# Patient Record
Sex: Female | Born: 2005
Health system: Southern US, Community
[De-identification: ages and names within clinical notes are randomized; demographics above are authoritative.]

## PROBLEM LIST (undated history)

## (undated) DIAGNOSIS — S92402A Displaced unspecified fracture of left great toe, initial encounter for closed fracture: Secondary | ICD-10-CM

## (undated) HISTORY — DX: Displaced unspecified fracture of left great toe, initial encounter for closed fracture: S92.402A

---
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
## 2023-06-08 ENCOUNTER — Ambulatory Visit (INDEPENDENT_AMBULATORY_CARE_PROVIDER_SITE_OTHER): Payer: 59 | Admitting: Physician Assistant

## 2023-06-08 VITALS — BP 112/72 | HR 64 | Temp 98.0°F | Ht 68.0 in | Wt 170.8 lb

## 2023-06-08 DIAGNOSIS — Z0000 Encounter for general adult medical examination without abnormal findings: Secondary | ICD-10-CM

## 2023-06-08 DIAGNOSIS — N898 Other specified noninflammatory disorders of vagina: Secondary | ICD-10-CM

## 2023-06-08 DIAGNOSIS — Z23 Encounter for immunization: Secondary | ICD-10-CM | POA: Diagnosis not present

## 2023-06-08 DIAGNOSIS — D485 Neoplasm of uncertain behavior of skin: Secondary | ICD-10-CM | POA: Diagnosis not present

## 2023-06-08 DIAGNOSIS — L905 Scar conditions and fibrosis of skin: Secondary | ICD-10-CM

## 2023-06-08 MED ORDER — FLUCONAZOLE 150 MG PO TABS
150.0000 mg | ORAL_TABLET | Freq: Every day | ORAL | 0 refills | Status: DC
Start: 2023-06-08 — End: 2024-01-20

## 2023-06-08 NOTE — Addendum Note (Signed)
Addended by: Ila Mcgill on: 06/08/2023 09:40 AM   Modules accepted: Orders

## 2023-06-08 NOTE — Progress Notes (Signed)
Subjective:    Patient ID: Traci Jennings, female    DOB: 04/28/2006, 17 y.o.   MRN: [id number]  Chief Complaint  Patient presents with   Annual Exam    Pt in office for annual CPE and fasting labs; pt c/o recent yeast infection 2 wks ago, took Diflucan but came back. Wanting to know if she can have a Rx to take 1 more and to keep on hand for when this happens. Thinks it is due to her birth control.    HPI Patient is in today for annual CPE. Working full-time, going into college soon as well.   Acute concerns: A few weeks ago, had some vaginal discharge, felt somewhat uncomfortable. Took Diflucan and felt better. Now has irritation again around introitus, but no pain.   Health maintenance: Lifestyle/ exercise: Walking at work, stays physically active  Nutrition: Better balance lately; does not eat breakfast, usually small meals / snacks Mental health: doing ok Sleep: doing ok Substance use: None Sexual activity: Boyfriend  Immunizations: Updated HPV today  Skin: Right upper arm dark lesion, slightly raised   Past Medical History:  Diagnosis Date   Fracture of left great toe     No past surgical history on file.  Family History  Problem Relation Age of Onset   Hypertension Father    Hyperlipidemia Father    Diabetes Father    Hypertension Maternal Grandmother    Diabetes Maternal Grandmother    Diabetes Maternal Grandfather    COPD Paternal Grandfather     Social History   Tobacco Use   Smoking status: Never   Smokeless tobacco: Never  Vaping Use   Vaping Use: Former  Substance Use Topics   Alcohol use: No   Drug use: No     No Known Allergies  Review of Systems NEGATIVE UNLESS OTHERWISE INDICATED IN HPI      Objective:     BP 112/72 (BP Location: Left Arm)   Pulse 64   Temp 98 F (36.7 C) (Temporal)   Ht 5\' 8"  (1.727 m)   Wt 170 lb 12.8 oz (77.5 kg)   SpO2 98%   BMI 25.97 kg/m   Wt Readings from Last 3 Encounters:  06/08/23 170 lb 12.8 oz  (77.5 kg) (93 %, Z= 1.47)*  12/24/22 175 lb 8 oz (79.6 kg) (94 %, Z= 1.59)*  10/27/22 181 lb 12.8 oz (82.5 kg) (96 %, Z= 1.71)*   * Growth percentiles are based on CDC (Girls, 2-20 Years) data.    BP Readings from Last 3 Encounters:  06/08/23 112/72  12/24/22 110/60  10/27/22 104/70     Physical Exam Vitals and nursing note reviewed.  Constitutional:      Appearance: Normal appearance. She is normal weight. She is not toxic-appearing.  HENT:     Head: Normocephalic and atraumatic.     Right Ear: Tympanic membrane, ear canal and external ear normal.     Left Ear: Tympanic membrane, ear canal and external ear normal.     Nose: Nose normal.     Mouth/Throat:     Mouth: Mucous membranes are moist.  Eyes:     Extraocular Movements: Extraocular movements intact.     Conjunctiva/sclera: Conjunctivae normal.     Pupils: Pupils are equal, round, and reactive to light.  Cardiovascular:     Rate and Rhythm: Normal rate and regular rhythm.     Pulses: Normal pulses.     Heart sounds: Normal heart sounds.  Pulmonary:  Effort: Pulmonary effort is normal.     Breath sounds: Normal breath sounds.  Abdominal:     General: Abdomen is flat. Bowel sounds are normal.     Palpations: Abdomen is soft.  Musculoskeletal:        General: Normal range of motion.     Cervical back: Normal range of motion and neck supple.  Skin:    General: Skin is warm and dry.     Findings: Lesion (right upper arm anterior dark slightly raised nevus) present.  Neurological:     General: No focal deficit present.     Mental Status: She is alert and oriented to person, place, and time.  Psychiatric:        Mood and Affect: Mood normal.        Behavior: Behavior normal.        Assessment & Plan:  Encounter for annual physical exam  Immunization due -     HPV 9-valent vaccine,Recombinat  Vaginal discharge -     Fluconazole; Take 1 tablet (150 mg total) by mouth daily. Repeat dose in 72 hours if still  symptomatic.  Dispense: 2 tablet; Refill: 0  Neoplasm of uncertain behavior of skin   Age-appropriate screening and counseling performed today. Will check labs and call with results. Preventive measures discussed and printed in AVS for patient.   Patient Counseling: [x]   Nutrition: Stressed importance of moderation in sodium/caffeine intake, saturated fat and cholesterol, caloric balance, sufficient intake of fresh fruits, vegetables, and fiber.  [x]   Stressed the importance of regular exercise.   [x]   Substance Abuse: Discussed cessation/primary prevention of tobacco, alcohol, or other drug use; driving or other dangerous activities under the influence; availability of treatment for abuse.   [x]   Injury prevention: Discussed safety belts, safety helmets, smoke detector, smoking near bedding or upholstery.   [x]   Sexuality: Discussed sexually transmitted diseases, partner selection, use of condoms, avoidance of unintended pregnancy  and contraceptive alternatives.   [x]   Dental health: Discussed importance of regular tooth brushing, flossing, and dental visits.  [x]   Health maintenance and immunizations reviewed. Please refer to Health maintenance section.       Return in about 3 weeks (around 06/29/2023), or right arm mole punch biopsy.    Vincient Vanaman M James Lafalce, PA-C

## 2023-06-08 NOTE — Patient Instructions (Addendum)
Schedule next CPE in 1 year  3-4 weeks for punch biopsy   Let me know through Mychart if irritation not improving with Diflucan  Keep working on good lifestyle habits!  Call if concerns.

## 2023-06-09 ENCOUNTER — Telehealth: Payer: Self-pay | Admitting: Physician Assistant

## 2023-06-09 NOTE — Telephone Encounter (Signed)
Patient's mother called stating they were able to get OV with dermatology on 7/18. States PCP was planning on getting a sample of concerning mole to send for testing at 7/11 OV. Caller wants to know if pt should still come in to see PCP on 7/11 or just go to dermatology on 7/18. States she does prefer for pt to go to PCP but would do what's recommended.

## 2023-06-30 ENCOUNTER — Ambulatory Visit: Payer: 59 | Admitting: Physician Assistant

## 2023-07-07 ENCOUNTER — Ambulatory Visit: Payer: 59 | Admitting: Dermatology

## 2023-07-07 VITALS — BP 103/70

## 2023-07-07 DIAGNOSIS — Z7189 Other specified counseling: Secondary | ICD-10-CM

## 2023-07-07 DIAGNOSIS — D229 Melanocytic nevi, unspecified: Secondary | ICD-10-CM

## 2023-07-07 DIAGNOSIS — D2261 Melanocytic nevi of right upper limb, including shoulder: Secondary | ICD-10-CM | POA: Diagnosis not present

## 2023-07-07 DIAGNOSIS — D485 Neoplasm of uncertain behavior of skin: Secondary | ICD-10-CM

## 2023-07-07 HISTORY — DX: Melanocytic nevi, unspecified: D22.9

## 2023-07-07 NOTE — Patient Instructions (Addendum)
Patient Handout: Wound Care for Skin Biopsy Site  Patient Handout: Wound Care for Skin Biopsy Site  Taking Care of Your Skin Biopsy Site  Proper care of the biopsy site is essential for promoting healing and minimizing scarring. This handout provides instructions on how to care for your biopsy site to ensure optimal recovery.  1. Cleaning the Wound:  Clean the biopsy site daily with gentle soap and water. Gently pat the area dry with a clean, soft towel. Avoid harsh scrubbing or rubbing the area, as this can irritate the skin and delay healing.  2. Applying Aquaphor and Bandage:  After cleaning the wound, apply a thin layer of Aquaphor ointment to the biopsy site. Cover the area with a sterile bandage to protect it from dirt, bacteria, and friction. Change the bandage daily or as needed if it becomes soiled or wet.  3. Continued Care for One Week:  Repeat the cleaning, Aquaphor application, and bandaging process daily for one week following the biopsy procedure. Keeping the wound clean and moist during this initial healing period will help prevent infection and promote optimal healing.  4. Massaging Aquaphor into the Area:  ---After one week, discontinue the use of bandages but continue to apply Aquaphor to the biopsy site. ----Gently massage the Aquaphor into the area using circular motions. ---Massaging the skin helps to promote circulation and prevent the formation of scar tissue.   Additional Tips:  Avoid exposing the biopsy site to direct sunlight during the healing process, as this can cause hyperpigmentation or worsen scarring. If you experience any signs of infection, such as increased redness, swelling, warmth, or drainage from the wound, contact your healthcare provider immediately. Follow any additional instructions provided by your healthcare provider for caring for the biopsy site and managing any discomfort. Conclusion:  Taking proper care of your skin biopsy site  is crucial for ensuring optimal healing and minimizing scarring. By following these instructions for cleaning, applying Aquaphor, and massaging the area, you can promote a smooth and successful recovery. If you have any questions or concerns about caring for your biopsy site, don't hesitate to contact your healthcare provider for guidance.      Due to recent changes in healthcare laws, you may see results of your pathology and/or laboratory studies on MyChart before the doctors have had a chance to review them. We understand that in some cases there may be results that are confusing or concerning to you. Please understand that not all results are received at the same time and often the doctors may need to interpret multiple results in order to provide you with the best plan of care or course of treatment. Therefore, we ask that you please give us 2 business days to thoroughly review all your results before contacting the office for clarification. Should we see a critical lab result, you will be contacted sooner.   If You Need Anything After Your Visit  If you have any questions or concerns for your doctor, please call our main line at [phone] If no one answers, please leave a voicemail as directed and we will return your call as soon as possible. Messages left after 4 pm will be answered the following business day.   You may also send us a message via MyChart. We typically respond to MyChart messages within 1-2 business days.  For prescription refills, please ask your pharmacy to contact our office. Our fax number is [phone].  If you have an urgent issue when the clinic is   closed that cannot wait until the next business day, you can page your doctor at the number below.    Please note that while we do our best to be available for urgent issues outside of office hours, we are not available 24/7.   If you have an urgent issue and are unable to reach us, you may choose to seek medical care  at your doctor's office, retail clinic, urgent care center, or emergency room.  If you have a medical emergency, please immediately call 911 or go to the emergency department. In the event of inclement weather, please call our main line at [phone] for an update on the status of any delays or closures.  Dermatology Medication Tips: Please keep the boxes that topical medications come in in order to help keep track of the instructions about where and how to use these. Pharmacies typically print the medication instructions only on the boxes and not directly on the medication tubes.   If your medication is too expensive, please contact our office at [phone] or send us a message through MyChart.   We are unable to tell what your co-pay for medications will be in advance as this is different depending on your insurance coverage. However, we may be able to find a substitute medication at lower cost or fill out paperwork to get insurance to cover a needed medication.   If a prior authorization is required to get your medication covered by your insurance company, please allow us 1-2 business days to complete this process.  Drug prices often vary depending on where the prescription is filled and some pharmacies may offer cheaper prices.  The website www.goodrx.com contains coupons for medications through different pharmacies. The prices here do not account for what the cost may be with help from insurance (it may be cheaper with your insurance), but the website can give you the price if you did not use any insurance.  - You can print the associated coupon and take it with your prescription to the pharmacy.  - You may also stop by our office during regular business hours and pick up a GoodRx coupon card.  - If you need your prescription sent electronically to a different pharmacy, notify our office through [place] MyChart or by phone at [phone]     

## 2023-07-07 NOTE — Progress Notes (Signed)
   New Patient Visit   Subjective  Traci Jennings is a 17 y.o. female who presents for the following: a spot at right upper arm. Patient noticed it getting darker about a year ago and it has gotten raised. Her PCP recommended she have it checked. No fhx of skin cancer that she is aware of. No tanning bed use.    The following portions of the chart were reviewed this encounter and updated as appropriate: medications, allergies, medical history  Review of Systems:  No other skin or systemic complaints except as noted in HPI or Assessment and Plan.  Objective  Well appearing patient in no apparent distress; mood and affect are within normal limits.   A focused examination was performed of the following areas: arms  Relevant exam findings are noted in the Assessment and Plan.  right upper arm 6 mm slightly irregular, brown/black papule       Assessment & Plan   1. Right Upper Arm Mole - Assessment: 6mm, slightly irregular, brown-black papule on the right upper arm. - Plan: Perform shave biopsy to rule out dysplastic nevi or malignancy. Send specimen to the lab for histopathological examination. Await results. - Patient Education: Instruct patient to apply Vaseline and a band-aid for two weeks, followed by massaging Vaseline into the area for an additional two to four weeks to improve scarring. - Follow-up: Notify patient via MyChart if biopsy result is benign. Contact patient by phone if result is abnormal. Ensure patient's correct phone number is on file.  2. Sun Exposure and Skin Care - Plan: Educate patient on risks of tanning and sun damage, including premature aging and increased risk of skin cancer. Advise wearing sunscreen and avoiding excessive sun exposure. - Follow-up: Encourage patient to maintain good sun protection habits and monitor skin for changes. Regular dermatology check-ups as needed.  Neoplasm of uncertain behavior of skin right upper arm  Skin / nail  biopsy Type of biopsy: tangential   Informed consent: discussed and consent obtained   Timeout: patient name, date of birth, surgical site, and procedure verified   Procedure prep:  Patient was prepped and draped in usual sterile fashion Prep type:  Isopropyl alcohol Anesthesia: the lesion was anesthetized in a standard fashion   Anesthetic:  1% lidocaine w/ epinephrine 1-100,000 buffered w/ 8.4% NaHCO3 Instrument used: flexible razor blade   Hemostasis achieved with: pressure, aluminum chloride and electrodesiccation   Outcome: patient tolerated procedure well   Post-procedure details: sterile dressing applied and wound care instructions given   Dressing type: bandage and petrolatum      Return if symptoms worsen or fail to improve.  Anise Salvo, RMA, am acting as scribe for [employer], DO .   Documentation: I have reviewed the above documentation for accuracy and completeness, and I agree with the above.  Traci Reusing, DO

## 2023-07-14 NOTE — Progress Notes (Signed)
Hi Traci Jennings  Dr. Onalee Hua reviewed your biopsy results and they showed the spot removed was a little "abnormal" but not cancerous.  The margins were clear so No additional treatment is required.  We will continue to monitor the area for re-pigmentation during your annual skin exams. The detailed report is available to view in MyChart.  Have a great day!  Kind Regards,  Dr. Kermit Balo Care Team

## 2023-08-04 ENCOUNTER — Encounter: Payer: Self-pay | Admitting: Physician Assistant

## 2023-08-04 ENCOUNTER — Ambulatory Visit (INDEPENDENT_AMBULATORY_CARE_PROVIDER_SITE_OTHER): Payer: 59 | Admitting: Physician Assistant

## 2023-08-04 VITALS — BP 110/70 | HR 73 | Temp 97.5°F | Ht 68.0 in | Wt 168.4 lb

## 2023-08-04 DIAGNOSIS — M79675 Pain in left toe(s): Secondary | ICD-10-CM

## 2023-08-04 NOTE — Patient Instructions (Addendum)
It was great to see you!  Consider buddy taping the area Wear a stiff shoe to prevent bending your foot/toes Consider ibuprofen Ice and elevate  If you choose to get an xray -- here is the info  An order for xray has been put in for you. To have this done, you can walk in at the [facility] location without a scheduled appointment.  The address is [address] It is across the street from [facility]. xray are located in the basement.   Hours of operation are M-F 8:30am to 5:00pm.  Please note that they are closed for lunch between 12:30 and 1:00pm.   Take care,  Jarold Motto PA-C

## 2023-08-04 NOTE — Progress Notes (Signed)
Traci Jennings is a 17 y.o. female here for a new problem.  History of Present Illness:   Chief Complaint  Patient presents with   c/o Toe problem    Pt c/o left Great toe discomfort, injured last week dropped piece a furniture on it.     HPI  Great Toe Pain (Left): Complains of left great toe pain, that began a week ago after dropping furniture onto it.  Describes mild bleeding immediately after trauma and occasional bleeding afterward. Reports mild discoloration, swelling, and numbness while moving.  Denies pain on movement, pain on palpation, or discharge from injury.    Past Medical History:  Diagnosis Date   Fracture of left great toe      Social History   Tobacco Use   Smoking status: Never   Smokeless tobacco: Never  Vaping Use   Vaping status: Former  Substance Use Topics   Alcohol use: No   Drug use: No    No past surgical history on file.  Family History  Problem Relation Age of Onset   Hypertension Father    Hyperlipidemia Father    Diabetes Father    Hypertension Maternal Grandmother    Diabetes Maternal Grandmother    Diabetes Maternal Grandfather    COPD Paternal Grandfather     No Known Allergies  Current Medications:   Current Outpatient Medications:    fluconazole (DIFLUCAN) 150 MG tablet, Take 1 tablet (150 mg total) by mouth daily. Repeat dose in 72 hours if still symptomatic., Disp: 2 tablet, Rfl: 0   levonorgestrel (KYLEENA) 19.5 MG IUD, 1 each by Intrauterine route once., Disp: , Rfl:    Review of Systems:   Review of Systems  Musculoskeletal:        (+) Foot pain (left great toe, see HPI) (+) Foot swelling (left great toe)  Skin:        (+) Discoloration (mild, left great toe)  Endo/Heme/Allergies:        (+) Bleeding (from left great toe after trauma)    Vitals:   Vitals:   08/04/23 0846  BP: 110/70  Pulse: 73  Temp: (!) 97.5 F (36.4 C)  TempSrc: Temporal  SpO2: 99%  Weight: 168 lb 6.1 oz (76.4 kg)  Height: 5\' 8"   (1.727 m)     Body mass index is 25.6 kg/m.  Physical Exam:   Physical Exam Constitutional:      Appearance: Normal appearance. She is well-developed.  HENT:     Head: Normocephalic and atraumatic.  Eyes:     General: Lids are normal.     Extraocular Movements: Extraocular movements intact.     Conjunctiva/sclera: Conjunctivae normal.  Cardiovascular:     Comments: Normal capillary refill Pulmonary:     Effort: Pulmonary effort is normal.  Musculoskeletal:        General: Normal range of motion.     Cervical back: Normal range of motion and neck supple.     Comments: Left great toe with slight erythema to medial nail edge Slight tenderness to palpation to great toe nail Overall normal ROM  Skin:    General: Skin is warm and dry.  Neurological:     Mental Status: She is alert and oriented to person, place, and time.     Comments: Normal sensation to great toe  Psychiatric:        Attention and Perception: Attention and perception normal.        Mood and Affect: Mood normal.  Behavior: Behavior normal.        Thought Content: Thought content normal.        Judgment: Judgment normal.     Assessment and Plan:   Great toe pain, left No red flags Offered xray vs another week of conservative treatment -- recommend buddy taping, hard sole shoe, NSAIDs and elevation She has chosen to do another week of supportive care and will then plan to get an xray if no better (mom was present and wanted xray sooner - I have ordered this now in case she would like to do this now)  [employer] as a scribe for [employer], PA.,have documented all relevant documentation on the behalf of Jarold Motto, PA,as directed by  Jarold Motto, PA while in the presence of Jarold Motto, [place].  I, Jarold Motto, [place], have reviewed all documentation for this visit. The documentation on 08/04/23 for the exam, diagnosis, procedures, and orders are all accurate and  complete.  Jarold Motto, PA-C

## 2023-09-08 NOTE — Progress Notes (Signed)
Established patient visit   Patient: Traci Jennings   DOB: February 08, 2006   16 y.o. Female  MRN: [id number] Visit Date: 09/09/2023  Today's healthcare provider: Alfredia Ferguson, PA-C   Chief Complaint  Patient presents with   bumps    Bumps on legs patient noticed 3 days ago    Subjective    HPI  Pt reports little bumps , itchy, on her legs, upper arms. Reports waxing her legs a few days ago. Worried about bed bugs, contagious rashes.   Medications: Outpatient Medications Prior to Visit  Medication Sig   levonorgestrel (KYLEENA) 19.5 MG IUD 1 each by Intrauterine route once.   fluconazole (DIFLUCAN) 150 MG tablet Take 1 tablet (150 mg total) by mouth daily. Repeat dose in 72 hours if still symptomatic. (Patient not taking: Reported on 09/09/2023)   No facility-administered medications prior to visit.    Review of Systems  Constitutional:  Negative for fatigue and fever.  Respiratory:  Negative for cough and shortness of breath.   Cardiovascular:  Negative for chest pain and leg swelling.  Gastrointestinal:  Negative for abdominal pain.  Skin:  Positive for rash.  Neurological:  Negative for dizziness and headaches.      Objective    BP 104/68 (BP Location: Left Arm, Patient Position: Sitting, Cuff Size: Normal)   Pulse 89   Temp 98.1 F (36.7 C) (Oral)   Resp 18   Wt 171 lb (77.6 kg)   SpO2 98%   BMI 26.00 kg/m   Physical Exam Vitals reviewed.  Constitutional:      Appearance: She is not ill-appearing.  HENT:     Head: Normocephalic.  Eyes:     Conjunctiva/sclera: Conjunctivae normal.  Cardiovascular:     Rate and Rhythm: Normal rate.  Pulmonary:     Effort: Pulmonary effort is normal. No respiratory distress.  Skin:    Comments: Fine prickly rash, non erythematous along b/l legs and upper arms. A few larger spots that are erythematous, two behind R knee that have scabbed over.  Neurological:     General: No focal deficit present.     Mental Status:  She is alert and oriented to person, place, and time.  Psychiatric:        Mood and Affect: Mood normal.        Behavior: Behavior normal.      No results found for any visits on 09/09/23.  Assessment & Plan     1. Rash Appears like keratosis pilaris that pt has scratched and aggravated Recommending topical mupirocin for inflamed spots, otherwise otc hydrocortisone and hydrating lotions/creams  - mupirocin ointment (BACTROBAN) 2 %; Apply 1 Application topically 2 (two) times daily.  Dispense: 30 g; Refill: 1   Return if symptoms worsen or fail to improve.      I, Alfredia Ferguson, PA-C have reviewed all documentation for this visit. The documentation on  09/09/23   for the exam, diagnosis, procedures, and orders are all accurate and complete.    Alfredia Ferguson, PA-C  [facility] Primary Care at [facility] [phone] (phone) [phone] (fax)  [facility]

## 2023-09-09 ENCOUNTER — Ambulatory Visit (INDEPENDENT_AMBULATORY_CARE_PROVIDER_SITE_OTHER): Payer: 59 | Admitting: Physician Assistant

## 2023-09-09 ENCOUNTER — Encounter: Payer: Self-pay | Admitting: Physician Assistant

## 2023-09-09 VITALS — BP 104/68 | HR 89 | Temp 98.1°F | Resp 18 | Wt 171.0 lb

## 2023-09-09 DIAGNOSIS — R21 Rash and other nonspecific skin eruption: Secondary | ICD-10-CM

## 2023-09-09 MED ORDER — MUPIROCIN 2 % EX OINT: 1.0000 | TOPICAL_OINTMENT | Freq: Two times a day (BID) | CUTANEOUS | 1 refills | Status: AC

## 2023-09-09 MED ORDER — MUPIROCIN 2 % EX OINT
1.0000 | TOPICAL_OINTMENT | Freq: Two times a day (BID) | CUTANEOUS | 1 refills | Status: DC
Start: 2023-09-09 — End: 2023-09-09

## 2023-09-14 ENCOUNTER — Other Ambulatory Visit (HOSPITAL_BASED_OUTPATIENT_CLINIC_OR_DEPARTMENT_OTHER): Payer: Self-pay

## 2024-01-20 ENCOUNTER — Other Ambulatory Visit (HOSPITAL_BASED_OUTPATIENT_CLINIC_OR_DEPARTMENT_OTHER): Payer: Self-pay

## 2024-01-20 ENCOUNTER — Ambulatory Visit: Payer: Commercial Managed Care - PPO | Admitting: Physician Assistant

## 2024-01-20 ENCOUNTER — Encounter: Payer: Self-pay | Admitting: Physician Assistant

## 2024-01-20 VITALS — BP 113/75 | HR 60 | Temp 97.7°F | Ht 68.0 in | Wt 168.4 lb

## 2024-01-20 DIAGNOSIS — T3695XA Adverse effect of unspecified systemic antibiotic, initial encounter: Secondary | ICD-10-CM | POA: Diagnosis not present

## 2024-01-20 DIAGNOSIS — N3000 Acute cystitis without hematuria: Secondary | ICD-10-CM | POA: Diagnosis not present

## 2024-01-20 DIAGNOSIS — B379 Candidiasis, unspecified: Secondary | ICD-10-CM | POA: Diagnosis not present

## 2024-01-20 DIAGNOSIS — R1030 Lower abdominal pain, unspecified: Secondary | ICD-10-CM | POA: Diagnosis not present

## 2024-01-20 LAB — POC URINALSYSI DIPSTICK (AUTOMATED)
Blood, UA: NEGATIVE
Glucose, UA: NEGATIVE
Ketones, UA: NEGATIVE
Nitrite, UA: POSITIVE
Protein, UA: NEGATIVE
Spec Grav, UA: 1.015 (ref 1.010–1.025)
Urobilinogen, UA: 0.2 U/dL
pH, UA: 6 (ref 5.0–8.0)

## 2024-01-20 MED ORDER — FLUCONAZOLE 150 MG PO TABS
150.0000 mg | ORAL_TABLET | Freq: Once | ORAL | 0 refills | Status: AC
Start: 2024-01-20 — End: 2024-01-21
  Filled 2024-01-20: qty 1, 1d supply, fill #0

## 2024-01-20 MED ORDER — NITROFURANTOIN MONOHYD MACRO 100 MG PO CAPS
100.0000 mg | ORAL_CAPSULE | Freq: Two times a day (BID) | ORAL | 0 refills | Status: DC
Start: 2024-01-20 — End: 2024-02-16
  Filled 2024-01-20: qty 10, 5d supply, fill #0

## 2024-01-20 NOTE — Progress Notes (Signed)
Established patient visit   Patient: Traci Jennings   DOB: 2006-07-29   18 y.o. Female  MRN: [id number] Visit Date: 01/20/2024  Today's healthcare provider: Alfredia Ferguson, PA-C   Chief Complaint  Patient presents with   Urinary Tract Infection    Some back pain- some cramping. OTC- UTI medicine    Subjective     Pt reports for the last few days, low back pain, abdominal cramping, x 1-2 days.  She has taken Azo over the counter. Denies dysuria, hematuria.   Medications: Outpatient Medications Prior to Visit  Medication Sig   levonorgestrel (KYLEENA) 19.5 MG IUD 1 each by Intrauterine route once.   mupirocin ointment (BACTROBAN) 2 % Apply 1 Application topically 2 (two) times daily.   [DISCONTINUED] fluconazole (DIFLUCAN) 150 MG tablet Take 1 tablet (150 mg total) by mouth daily. Repeat dose in 72 hours if still symptomatic.   No facility-administered medications prior to visit.    Review of Systems  Constitutional:  Negative for fatigue and fever.  Respiratory:  Negative for cough and shortness of breath.   Cardiovascular:  Negative for chest pain and leg swelling.  Gastrointestinal:  Positive for abdominal pain.  Musculoskeletal:  Positive for back pain.  Neurological:  Negative for dizziness and headaches.       Objective    BP 113/75   Pulse 60   Temp 97.7 F (36.5 C) (Oral)   Ht 5\' 8"  (1.727 m)   Wt 168 lb 6 oz (76.4 kg)   SpO2 94%   BMI 25.60 kg/m    Physical Exam Vitals reviewed.  Constitutional:      Appearance: She is not ill-appearing.  HENT:     Head: Normocephalic.  Eyes:     Conjunctiva/sclera: Conjunctivae normal.  Cardiovascular:     Rate and Rhythm: Normal rate.  Pulmonary:     Effort: Pulmonary effort is normal. No respiratory distress.  Abdominal:     Palpations: Abdomen is soft.     Tenderness: There is no abdominal tenderness.  Neurological:     General: No focal deficit present.     Mental Status: She is alert and  oriented to person, place, and time.  Psychiatric:        Mood and Affect: Mood normal.        Behavior: Behavior normal.      Results for orders placed or performed in visit on 01/20/24  POCT Urinalysis Dipstick (Automated)  Result Value Ref Range   Color, UA Yellow    Clarity, UA Clear    Glucose, UA Negative Negative   Bilirubin, UA Small    Ketones, UA Negative    Spec Grav, UA 1.015 1.010 - 1.025   Blood, UA Negative    pH, UA 6.0 5.0 - 8.0   Protein, UA Negative Negative   Urobilinogen, UA 0.2 0.2 or 1.0 E.U./dL   Nitrite, UA Positive    Leukocytes, UA Trace (A) Negative    Assessment & Plan    Acute cystitis without hematuria -     Nitrofurantoin Monohyd Macro; Take 1 capsule (100 mg total) by mouth 2 (two) times daily.  Dispense: 10 capsule; Refill: 0  Antibiotic-induced yeast infection -     Fluconazole; Take 1 tablet (150 mg total) by mouth once for 1 dose.  Dispense: 1 tablet; Refill: 0  Lower abdominal pain -     POCT Urinalysis Dipstick (Automated)   UA + leuks, nitrites. Rx macrobid bid, ok to  cont Azo.  Rx diflucan in case of abx induced yeast infection  Return if symptoms worsen or fail to improve.       Alfredia Ferguson, PA-C  [facility] Primary Care at [facility] [phone] (phone) [phone] (fax)  [facility]

## 2024-01-21 LAB — URINE CULTURE
MICRO NUMBER:: 16025577
Result:: NO GROWTH
SPECIMEN QUALITY:: ADEQUATE

## 2024-01-23 ENCOUNTER — Encounter: Payer: Self-pay | Admitting: Physician Assistant

## 2024-02-15 ENCOUNTER — Ambulatory Visit: Payer: Commercial Managed Care - PPO | Admitting: Physician Assistant

## 2024-02-15 ENCOUNTER — Encounter: Payer: Self-pay | Admitting: Physician Assistant

## 2024-02-15 ENCOUNTER — Other Ambulatory Visit (HOSPITAL_COMMUNITY)
Admission: RE | Admit: 2024-02-15 | Discharge: 2024-02-15 | Disposition: A | Discharge status: Home / Self Care | Payer: Commercial Managed Care - PPO | Source: Ambulatory Visit | Attending: Physician Assistant | Admitting: Physician Assistant

## 2024-02-15 VITALS — BP 107/74 | HR 93 | Temp 98.4°F | Ht 68.0 in | Wt 163.0 lb

## 2024-02-15 DIAGNOSIS — M545 Low back pain, unspecified: Secondary | ICD-10-CM

## 2024-02-15 DIAGNOSIS — N898 Other specified noninflammatory disorders of vagina: Secondary | ICD-10-CM | POA: Insufficient documentation

## 2024-02-15 LAB — CERVICOVAGINAL ANCILLARY ONLY
Bacterial Vaginitis (gardnerella): POSITIVE — AB
Candida Glabrata: NEGATIVE
Candida Vaginitis: NEGATIVE
Chlamydia: NEGATIVE
Comment: NEGATIVE
Comment: NEGATIVE
Comment: NEGATIVE
Comment: NEGATIVE
Comment: NEGATIVE
Comment: NORMAL
Neisseria Gonorrhea: NEGATIVE
Trichomonas: NEGATIVE

## 2024-02-15 LAB — POC URINALSYSI DIPSTICK (AUTOMATED)
Blood, UA: NEGATIVE
Glucose, UA: NEGATIVE
Nitrite, UA: NEGATIVE
Protein, UA: POSITIVE — AB
Spec Grav, UA: >=1.03 — AB (ref 1.010–1.025)
Urobilinogen, UA: 0.2 U/dL
pH, UA: 5 (ref 5.0–8.0)

## 2024-02-15 NOTE — Progress Notes (Signed)
      Established patient visit   Patient: Traci Jennings   DOB: 01-02-2006   18 y.o. Female  MRN: [id number] Visit Date: 02/15/2024  Today's healthcare provider: Alfredia Ferguson, PA-C   Cc. Back pain, vaginal discharge  Subjective    Pt reports persistent  mid to low back pain, denies abdominal pain, urinary symptoms. She is having white/yellow vaginal discharge now. Denies taking any pain meds otc for her back pain.  Medications: Outpatient Medications Prior to Visit  Medication Sig   levonorgestrel (KYLEENA) 19.5 MG IUD 1 each by Intrauterine route once.   mupirocin ointment (BACTROBAN) 2 % Apply 1 Application topically 2 (two) times daily.   nitrofurantoin, macrocrystal-monohydrate, (MACROBID) 100 MG capsule Take 1 capsule (100 mg total) by mouth 2 (two) times daily.   No facility-administered medications prior to visit.    Review of Systems  Constitutional:  Negative for fatigue and fever.  Respiratory:  Negative for cough and shortness of breath.   Cardiovascular:  Negative for chest pain and leg swelling.  Gastrointestinal:  Negative for abdominal pain.  Genitourinary:  Positive for vaginal discharge.  Musculoskeletal:  Positive for back pain.  Neurological:  Negative for dizziness and headaches.       Objective    BP 107/74   Pulse 93   Temp 98.4 F (36.9 C) (Oral)   Ht 5\' 8"  (1.727 m)   Wt 163 lb (73.9 kg)   SpO2 95%   BMI 24.78 kg/m    Physical Exam Vitals reviewed.  Constitutional:      Appearance: She is not ill-appearing.  HENT:     Head: Normocephalic.  Eyes:     Conjunctiva/sclera: Conjunctivae normal.  Cardiovascular:     Rate and Rhythm: Normal rate.  Pulmonary:     Effort: Pulmonary effort is normal. No respiratory distress.  Skin:    Comments: No edema, erythema, rash along back.  Neurological:     Mental Status: She is alert and oriented to person, place, and time.  Psychiatric:        Mood and Affect: Mood normal.        Behavior:  Behavior normal.     No results found for any visits on 02/15/24.  Assessment & Plan    Vaginal discharge -     Cervicovaginal ancillary only -     POCT Urinalysis Dipstick (Automated)  Acute midline low back pain without sciatica   UA with protein, small bacteria. Not enough to culture. No urinary symptoms.  Recommending vaginal swab r/o vaginal infection.  Recommending alleve bid for back pain, hydrate.  Advised if she develops urinary symptoms to f/b for another urine sample.  Return if symptoms worsen or fail to improve.     Alfredia Ferguson, PA-C  [facility] Primary Care at [facility] [phone] (phone) [phone] (fax)  [facility]

## 2024-02-16 ENCOUNTER — Other Ambulatory Visit (HOSPITAL_COMMUNITY): Payer: Self-pay

## 2024-02-16 ENCOUNTER — Other Ambulatory Visit: Payer: Self-pay | Admitting: Physician Assistant

## 2024-02-16 ENCOUNTER — Encounter: Payer: Self-pay | Admitting: Physician Assistant

## 2024-02-16 DIAGNOSIS — N760 Acute vaginitis: Secondary | ICD-10-CM

## 2024-02-16 MED ORDER — METRONIDAZOLE 500 MG PO TABS
500.0000 mg | ORAL_TABLET | Freq: Two times a day (BID) | ORAL | 0 refills | Status: AC
Start: 2024-02-16 — End: 2024-02-23
  Filled 2024-02-16: qty 14, 7d supply, fill #0

## 2024-02-17 ENCOUNTER — Other Ambulatory Visit (HOSPITAL_COMMUNITY): Payer: Self-pay

## 2024-03-09 NOTE — Progress Notes (Signed)
 Can you clarify what you need?

## 2024-12-01 ENCOUNTER — Ambulatory Visit (HOSPITAL_BASED_OUTPATIENT_CLINIC_OR_DEPARTMENT_OTHER)
Admission: EM | Admit: 2024-12-01 | Discharge: 2024-12-01 | Disposition: A | Discharge status: Home / Self Care | Attending: Family Medicine | Admitting: Family Medicine

## 2024-12-01 ENCOUNTER — Encounter (HOSPITAL_BASED_OUTPATIENT_CLINIC_OR_DEPARTMENT_OTHER): Payer: Self-pay

## 2024-12-01 DIAGNOSIS — H65113 Acute and subacute allergic otitis media (mucoid) (sanguinous) (serous), bilateral: Secondary | ICD-10-CM | POA: Diagnosis not present

## 2024-12-01 DIAGNOSIS — J069 Acute upper respiratory infection, unspecified: Secondary | ICD-10-CM

## 2024-12-01 DIAGNOSIS — R509 Fever, unspecified: Secondary | ICD-10-CM

## 2024-12-01 DIAGNOSIS — R051 Acute cough: Secondary | ICD-10-CM | POA: Diagnosis not present

## 2024-12-01 LAB — POC COVID19/FLU A&B COMBO
Covid Antigen, POC: NEGATIVE
Influenza A Antigen, POC: NEGATIVE
Influenza B Antigen, POC: NEGATIVE

## 2024-12-01 MED ORDER — CEFDINIR 300 MG PO CAPS: 300.0000 mg | ORAL_CAPSULE | Freq: Two times a day (BID) | ORAL | 0 refills | Status: AC

## 2024-12-01 MED ORDER — FLUCONAZOLE 150 MG PO TABS: ORAL_TABLET | ORAL | 0 refills | Status: AC

## 2024-12-01 MED ORDER — ACETAMINOPHEN 325 MG PO TABS
650.0000 mg | ORAL_TABLET | Freq: Once | ORAL | Status: AC
  Administered 2024-12-01: 650 mg via ORAL

## 2024-12-01 MED ORDER — PREDNISONE 20 MG PO TABS: 20.0000 mg | ORAL_TABLET | Freq: Every day | ORAL | 0 refills | Status: AC

## 2024-12-01 NOTE — ED Triage Notes (Signed)
 Pt c/o sore throat nasal congestion chills sinus pressure bilateral ear pain cough since Thursday. Has been taking 12 hour mucinex/12 hours sudafed.

## 2024-12-01 NOTE — ED Provider Notes (Signed)
 [facility]    CSN: [id number] Arrival date & time: 12/01/24  0955      History   Chief Complaint No chief complaint on file.   HPI Traci Jennings is a 18 y.o. female.   18 year old female with complaint of sore throat, nasal congestion, sinus pressure, ear pain and chills that started on 12/07/2019 5 in the morning.  She has used Mucinex 12-hour and also used Sudafed with poor relief of symptoms.  She has a fever today.     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   History reviewed. No pertinent surgical history.  OB History     Gravida  0   Para  0   Term  0   Preterm  0   AB  0   Living  0      SAB  0   IAB  0   Ectopic  0   Multiple  0   Live Births  0            Home Medications    Prior to Admission medications  Medication Sig Start Date End Date Taking? Authorizing Provider  cefdinir  (OMNICEF ) 300 MG capsule Take 1 capsule (300 mg total) by mouth 2 (two) times daily for 10 days. 12/01/24 12/11/24 Yes Ival Domino, FNP  fluconazole  (DIFLUCAN ) 150 MG tablet By mouth today and repeat every 5 days for vaginal yeast infection.  May use up to four pills, if needed 12/01/24  Yes Ival Domino, FNP  levonorgestrel  (KYLEENA ) 19.5 MG IUD 1 each by Intrauterine route once.   Yes [provider]  predniSONE  (DELTASONE ) 20 MG tablet Take 1 tablet (20 mg total) by mouth daily with breakfast for 5 days. 12/01/24 12/06/24 Yes Ival Domino, FNP  mupirocin  ointment (BACTROBAN ) 2 % Apply 1 Application topically 2 (two) times daily. 09/09/23   Cyndi Shaver, PA-C    Family History Family History  Problem Relation Age of Onset   Hypertension Father    Hyperlipidemia Father    Diabetes Father    Hypertension Maternal Grandmother    Diabetes Maternal Grandmother    Diabetes Maternal  Grandfather    COPD Paternal Grandfather     Social History Social History[1]   Allergies   Patient has no known allergies.   Review of Systems Review of Systems  Constitutional:  Positive for chills. Negative for fever.  HENT:  Positive for congestion, ear pain, postnasal drip, rhinorrhea, sinus pressure and sinus pain. Negative for sore throat.   Eyes:  Negative for pain and visual disturbance.  Respiratory:  Positive for cough. Negative for shortness of breath.   Cardiovascular:  Negative for chest pain and palpitations.  Gastrointestinal:  Negative for abdominal pain, constipation, diarrhea, nausea and vomiting.  Genitourinary:  Negative for dysuria and hematuria.  Musculoskeletal:  Negative for arthralgias and back pain.  Skin:  Negative for color change and rash.  Neurological:  Negative for seizures and syncope.  All other systems reviewed and are negative.    Physical Exam Triage Vital Signs ED Triage Vitals  Encounter Vitals Group     BP 12/01/24 1154 109/71     Girls Systolic BP Percentile --      Girls Diastolic BP Percentile --      Boys Systolic BP Percentile --  Boys Diastolic BP Percentile --      Pulse Rate 12/01/24 1154 96     Resp 12/01/24 1154 18     Temp 12/01/24 1154 98.2 F (36.8 C)     Temp Source 12/01/24 1154 Oral     SpO2 12/01/24 1154 98 %     Weight --      Height --      Head Circumference --      Peak Flow --      Pain Score 12/01/24 1152 3     Pain Loc --      Pain Education --      Exclude from Growth Chart --    No data found.  Updated Vital Signs BP 109/71 (BP Location: Right Arm)   Pulse 96   Temp 98.2 F (36.8 C) (Oral)   Resp 18   SpO2 98%   Visual Acuity Right Eye Distance:   Left Eye Distance:   Bilateral Distance:    Right Eye Near:   Left Eye Near:    Bilateral Near:     Physical Exam Vitals and nursing note reviewed.  Constitutional:      General: She is not in acute distress.    Appearance: She  is well-developed. She is ill-appearing. She is not toxic-appearing or diaphoretic.  HENT:     Head: Normocephalic and atraumatic.     Right Ear: Hearing, ear canal and external ear normal. A middle ear effusion is present. Tympanic membrane is erythematous and bulging.     Left Ear: Hearing, ear canal and external ear normal. A middle ear effusion is present. Tympanic membrane is erythematous and bulging.     Nose: Congestion and rhinorrhea present. Rhinorrhea is clear.     Right Sinus: No maxillary sinus tenderness or frontal sinus tenderness.     Left Sinus: No maxillary sinus tenderness or frontal sinus tenderness.     Mouth/Throat:     Lips: Pink.     Mouth: Mucous membranes are moist.     Pharynx: Uvula midline. No oropharyngeal exudate or posterior oropharyngeal erythema.     Tonsils: No tonsillar exudate.  Eyes:     Conjunctiva/sclera: Conjunctivae normal.     Pupils: Pupils are equal, round, and reactive to light.  Cardiovascular:     Rate and Rhythm: Normal rate and regular rhythm.     Heart sounds: S1 normal and S2 normal. No murmur heard. Pulmonary:     Effort: Pulmonary effort is normal. No respiratory distress.     Breath sounds: Examination of the right-upper field reveals wheezing. Examination of the left-upper field reveals wheezing. Wheezing (Light inspiratory wheezes bilaterally in the anterior upper lobes only.) present. No decreased breath sounds, rhonchi or rales.  Abdominal:     General: Bowel sounds are normal.     Palpations: Abdomen is soft.     Tenderness: There is no abdominal tenderness.  Musculoskeletal:        General: No swelling.     Cervical back: Neck supple.  Lymphadenopathy:     Head:     Right side of head: Tonsillar adenopathy present. No submental, submandibular, preauricular or posterior auricular adenopathy.     Left side of head: Tonsillar adenopathy present. No submental, submandibular, preauricular or posterior auricular adenopathy.      Cervical: Cervical adenopathy present.     Right cervical: Superficial cervical adenopathy present.     Left cervical: Superficial cervical adenopathy present.  Skin:    General: Skin  is warm and dry.     Capillary Refill: Capillary refill takes less than 2 seconds.     Findings: No rash.  Neurological:     Mental Status: She is alert and oriented to person, place, and time.  Psychiatric:        Mood and Affect: Mood normal.      UC Treatments / Results  Labs (all labs ordered are listed, but only abnormal results are displayed) Labs Reviewed  POC COVID19/FLU A&B COMBO    EKG   Radiology No results found.  Procedures Procedures (including critical care time)  Medications Ordered in UC Medications  acetaminophen  (TYLENOL ) tablet 650 mg (650 mg Oral Given 12/01/24 1259)    Initial Impression / Assessment and Plan / UC Course  I have reviewed the triage vital signs and the nursing notes.  Pertinent labs & imaging results that were available during my care of the patient were reviewed by me and considered in my medical decision making (see chart for details).  Plan of Care (see discharge instructions for additional patient precautions and education): Viral upper respiratory infection with cough, fever and bilateral ear infections:  Rapid flu and COVID are negative.   Get plenty of fluids and rest.   Prednisone  20 mg daily for 5 days.   Cefdinir  300 mg twice daily for 10 days.   Provided fluconazole  for use if needed, for vaginal yeast infection, due to antibiotic use. Work excuse provided. Follow-up if symptoms do not improve, worsen or new symptoms occur.  I reviewed the plan of care with the patient and/or the patient's guardian.  The patient and/or guardian had time to ask questions and acknowledged that the questions were answered.  Final Clinical Impressions(s) / UC Diagnoses   Final diagnoses:  Non-recurrent acute allergic otitis media of both ears  Viral URI   Acute cough  Fever, unspecified     Discharge Instructions      Viral upper respiratory infection with cough, fever and bilateral ear infections: Rapid flu and COVID are negative.  Get plenty of fluids and rest.  Prednisone  20 mg daily for 5 days.  Cefdinir  300 mg twice daily for 10 days.  Provided fluconazole  for use if needed, for vaginal yeast infection, due to antibiotic use.  Work excuse provided  Follow-up if symptoms do not improve, worsen or new symptoms occur.     ED Prescriptions     Medication Sig Dispense Auth. Provider   cefdinir  (OMNICEF ) 300 MG capsule Take 1 capsule (300 mg total) by mouth 2 (two) times daily for 10 days. 20 capsule Ival Domino, FNP   predniSONE  (DELTASONE ) 20 MG tablet Take 1 tablet (20 mg total) by mouth daily with breakfast for 5 days. 5 tablet Braxston Quinter, FNP   fluconazole  (DIFLUCAN ) 150 MG tablet By mouth today and repeat every 5 days for vaginal yeast infection.  May use up to four pills, if needed 4 tablet Ival Domino, FNP      PDMP not reviewed this encounter.    [1]  Social History Tobacco Use   Smoking status: Never   Smokeless tobacco: Never  Vaping Use   Vaping status: Former  Substance Use Topics   Alcohol use: No   Drug use: No     Ival Domino, FNP 12/01/24 1306

## 2024-12-01 NOTE — Discharge Instructions (Addendum)
 Viral upper respiratory infection with cough, fever and bilateral ear infections: Rapid flu and COVID are negative.  Get plenty of fluids and rest.  Prednisone  20 mg daily for 5 days.  Cefdinir  300 mg twice daily for 10 days.  Provided fluconazole  for use if needed, for vaginal yeast infection, due to antibiotic use.  Work excuse provided  Follow-up if symptoms do not improve, worsen or new symptoms occur.

## 2024-12-03 ENCOUNTER — Telehealth: Admitting: Family Medicine

## 2024-12-03 DIAGNOSIS — R062 Wheezing: Secondary | ICD-10-CM | POA: Diagnosis not present

## 2024-12-03 DIAGNOSIS — J069 Acute upper respiratory infection, unspecified: Secondary | ICD-10-CM | POA: Diagnosis not present

## 2024-12-03 MED ORDER — ALBUTEROL SULFATE HFA 108 (90 BASE) MCG/ACT IN AERS: 2.0000 | INHALATION_SPRAY | RESPIRATORY_TRACT | 0 refills | Status: AC | PRN

## 2024-12-03 NOTE — Progress Notes (Signed)
 Patient ID: Traci Jennings, female   DOB: 01/13/2006, 18 y.o.   MRN: [id number]   Virtual Visit via Video Note  I connected with Traci Jennings on 12/03/2024 at  9:45 AM EST by a video enabled telemedicine application and verified that I am speaking with the correct person using two identifiers.  Location patient: home Location provider:work or home office Persons participating in the virtual visit: patient, provider  I discussed the limitations of evaluation and management by telemedicine and the availability of in person appointments. The patient expressed understanding and agreed to proceed.   HPI:  Traci Jennings called with some ongoing wheezing.  She states she had onset last Thursday of some nasal congestion, sore throat, earache and cough.  She went to urgent care down in [place] on Saturday.  COVID and influenza testing were negative.  She was noted to have some wheezing on exam.  She was started on prednisone  along with cefdinir  for bilateral ear infections.  She apparently had a inhaler offered but she declined at that time but now feels like she may want to go ahead and start the inhaler as needed for some intermittent wheezing.  No history of asthma.  She is taking antibiotic and steroid regularly without any side effects.  Denies any fever.  No significant dyspnea.   ROS: See pertinent positives and negatives per HPI.  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 No pertinent surgical history.  Family History  Problem Relation Age of Onset   Hypertension Father    Hyperlipidemia Father    Diabetes Father    Hypertension Maternal Grandmother    Diabetes Maternal Grandmother    Diabetes Maternal Grandfather    COPD Paternal Grandfather     SOCIAL HX: Non-smoker  Current Medications[1]  EXAM:  VITALS per patient if applicable:  GENERAL: alert, oriented, appears well and in no  acute distress  HEENT: atraumatic, conjunttiva clear, no obvious abnormalities on inspection of external nose and ears  NECK: normal movements of the head and neck  LUNGS: on inspection no signs of respiratory distress, breathing rate appears normal, no obvious gross SOB, gasping or wheezing  CV: no obvious cyanosis  MS: moves all visible extremities without noticeable abnormality  PSYCH/NEURO: pleasant and cooperative, no obvious depression or anxiety, speech and thought processing grossly intact  ASSESSMENT AND PLAN:  Discussed the following assessment and plan:  Upper respiratory infection with cough and wheezing.  She was diagnosed this past weekend with bilateral ear infection currently on cefdinir .  She is already on prednisone .  She describes intermittent wheezing but no respiratory distress.  No increased work of breathing on virtual exam at this time.  We sent in albuterol  MDI 2 puffs every 4-6 hours as needed for cough and wheeze.  Finish out prednisone  and cefdinir .  Follow-up with primary for any persistent or worsening symptoms.     I discussed the assessment and treatment plan with the patient. The patient was provided an opportunity to ask questions and all were answered. The patient agreed with the plan and demonstrated an understanding of the instructions.   The patient was advised to call back or seek an in-person evaluation if the symptoms worsen or if the condition fails to improve as anticipated.     Wolm Scarlet, MD      [1]  Current Outpatient Medications:    albuterol  (  VENTOLIN  HFA) 108 (90 Base) MCG/ACT inhaler, Inhale 2 puffs into the lungs every 4 (four) hours as needed for wheezing or shortness of breath., Disp: 8 g, Rfl: 0   cefdinir  (OMNICEF ) 300 MG capsule, Take 1 capsule (300 mg total) by mouth 2 (two) times daily for 10 days., Disp: 20 capsule, Rfl: 0   fluconazole  (DIFLUCAN ) 150 MG tablet, By mouth today and repeat every 5 days for vaginal  yeast infection.  May use up to four pills, if needed, Disp: 4 tablet, Rfl: 0   levonorgestrel  (KYLEENA ) 19.5 MG IUD, 1 each by Intrauterine route once., Disp: , Rfl:    mupirocin  ointment (BACTROBAN ) 2 %, Apply 1 Application topically 2 (two) times daily., Disp: 30 g, Rfl: 1   predniSONE  (DELTASONE ) 20 MG tablet, Take 1 tablet (20 mg total) by mouth daily with breakfast for 5 days., Disp: 5 tablet, Rfl: 0
